# Patient Record
Sex: Male | Born: 2000 | Race: Black or African American | Hispanic: No | Marital: Single | Smoking: Never smoker
Health system: Southern US, Community
[De-identification: ages and names within clinical notes are randomized; demographics above are authoritative.]

## PROBLEM LIST (undated history)

## (undated) DIAGNOSIS — A630 Anogenital (venereal) warts: Secondary | ICD-10-CM

## (undated) DIAGNOSIS — L309 Dermatitis, unspecified: Secondary | ICD-10-CM

---

## 2020-09-07 ENCOUNTER — Emergency Department (HOSPITAL_COMMUNITY): Payer: Self-pay

## 2020-09-07 ENCOUNTER — Emergency Department (HOSPITAL_COMMUNITY)
Admission: EM | Admit: 2020-09-07 | Discharge: 2020-09-07 | Disposition: A | Discharge status: Home / Self Care | Payer: Self-pay | Attending: Emergency Medicine | Admitting: Emergency Medicine

## 2020-09-07 ENCOUNTER — Other Ambulatory Visit: Payer: Self-pay

## 2020-09-07 ENCOUNTER — Encounter (HOSPITAL_COMMUNITY): Payer: Self-pay

## 2020-09-07 DIAGNOSIS — R600 Localized edema: Secondary | ICD-10-CM | POA: Insufficient documentation

## 2020-09-07 DIAGNOSIS — Y9389 Activity, other specified: Secondary | ICD-10-CM | POA: Insufficient documentation

## 2020-09-07 DIAGNOSIS — M79645 Pain in left finger(s): Secondary | ICD-10-CM | POA: Insufficient documentation

## 2020-09-07 DIAGNOSIS — W501XXA Accidental kick by another person, initial encounter: Secondary | ICD-10-CM | POA: Insufficient documentation

## 2020-09-07 MED ORDER — IBUPROFEN 200 MG PO TABS
400.0000 mg | ORAL_TABLET | Freq: Once | ORAL | Status: AC
  Administered 2020-09-07: 400 mg via ORAL
  Filled 2020-09-07: qty 2

## 2020-09-07 MED ORDER — OXYCODONE-ACETAMINOPHEN 5-325 MG PO TABS
1.0000 | ORAL_TABLET | Freq: Once | ORAL | Status: AC
  Administered 2020-09-07: 1 via ORAL
  Filled 2020-09-07: qty 1
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
## 2020-12-18 ENCOUNTER — Other Ambulatory Visit: Payer: Self-pay

## 2020-12-18 ENCOUNTER — Emergency Department (HOSPITAL_COMMUNITY)
Admission: EM | Admit: 2020-12-18 | Discharge: 2020-12-19 | Disposition: A | Discharge status: Home / Self Care | Payer: Self-pay | Attending: Student | Admitting: Student

## 2020-12-18 DIAGNOSIS — R369 Urethral discharge, unspecified: Secondary | ICD-10-CM | POA: Insufficient documentation

## 2020-12-18 DIAGNOSIS — Z5321 Procedure and treatment not carried out due to patient leaving prior to being seen by health care provider: Secondary | ICD-10-CM | POA: Insufficient documentation

## 2020-12-18 DIAGNOSIS — Z202 Contact with and (suspected) exposure to infections with a predominantly sexual mode of transmission: Secondary | ICD-10-CM | POA: Insufficient documentation
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
## 2020-12-18 NOTE — ED Notes (Signed)
Pt left due to not being seen quick enough 

## 2020-12-19 LAB — SYPHILIS: RPR W/REFLEX TO RPR TITER AND TREPONEMAL ANTIBODIES, TRADITIONAL SCREENING AND DIAGNOSIS ALGORITHM: RPR Ser Ql: NONREACTIVE

## 2021-02-10 ENCOUNTER — Encounter (HOSPITAL_COMMUNITY): Payer: Self-pay

## 2021-02-10 ENCOUNTER — Emergency Department (HOSPITAL_COMMUNITY)
Admission: EM | Admit: 2021-02-10 | Discharge: 2021-02-10 | Disposition: A | Discharge status: Home / Self Care | Payer: Self-pay | Attending: Emergency Medicine | Admitting: Emergency Medicine

## 2021-02-10 ENCOUNTER — Other Ambulatory Visit: Payer: Self-pay

## 2021-02-10 DIAGNOSIS — K6289 Other specified diseases of anus and rectum: Secondary | ICD-10-CM

## 2021-02-10 DIAGNOSIS — L290 Pruritus ani: Secondary | ICD-10-CM | POA: Insufficient documentation

## 2021-02-10 HISTORY — DX: Anogenital (venereal) warts: A63.0

## 2021-02-10 MED ORDER — HYDROCORTISONE (PERIANAL) 2.5 % EX CREA: 1.0000 "application " | TOPICAL_CREAM | Freq: Two times a day (BID) | CUTANEOUS | 0 refills | Status: AC

## 2021-02-10 NOTE — ED Notes (Signed)
An After Visit Summary was printed and given to the patient. Discharge instructions given and no further questions at this time.  

## 2021-02-10 NOTE — Discharge Instructions (Addendum)
It is not clear exactly what the discomfort is caused by.  Stop using the podofilox cream as it is likely causing irritation.  We are prescribing a medication to help the discomfort, apply it twice a day to see if that helps.  Also soaking well daily in a warm tub will help, including cleaning well with soap and water.  Call the gastroenterologist if not better by next week.  They can see you for further evaluation and treatment.

## 2021-02-10 NOTE — ED Provider Notes (Signed)
[facility]-EMERGENCY DEPT Provider Note   CSN: [id number] Arrival date & time: 02/10/21  1557     History Chief Complaint  Patient presents with   Anal Warts    Phillip Cannon is a 20 y.o. male.  HPI He presents for sores around his anus that he is concerned about changing recently.  He has been treating himself with podofilox for anal warts, for several months.  He notes that this tends to cause the area to dry out and get irritated so he has been using it more sporadically, instead of as prescribed daily.  Over the last several days he has been concerned because he noticed bleeding and draining, when he had a bowel movement.  He is actively having sex with men.  He does not think that he has monkey box.  He denies fever, chills, nausea, vomiting or weakness or dizziness.  He is not having pain with defecation.  He does not have any abdominal pain.  There are no other known active modifying factors.    Past Medical History:  Diagnosis Date   Anal warts     There are no problems to display for this patient.   History reviewed. No pertinent surgical history.     History reviewed. No pertinent family history.  Social History   Tobacco Use   Smoking status: Never   Smokeless tobacco: Never    Home Medications Prior to Admission medications   Medication Sig Start Date End Date Taking? Authorizing Provider  hydrocortisone (ANUSOL-HC) 2.5 % rectal cream Place 1 application rectally 2 (two) times daily. 02/10/21  Yes Mancel Bale, MD    Allergies    Patient has no known allergies.  Review of Systems   Review of Systems  All other systems reviewed and are negative.  Physical Exam Updated Vital Signs BP 135/81   Pulse 66   Temp 98.7 F (37.1 C) (Oral)   Resp 18   SpO2 100%   Physical Exam Vitals and nursing note reviewed.  Constitutional:      Appearance: He is well-developed. He is not ill-appearing.  HENT:     Head:  Normocephalic and atraumatic.     Right Ear: External ear normal.     Left Ear: External ear normal.  Eyes:     Conjunctiva/sclera: Conjunctivae normal.     Pupils: Pupils are equal, round, and reactive to light.  Neck:     Trachea: Phonation normal.  Cardiovascular:     Rate and Rhythm: Normal rate.  Pulmonary:     Effort: Pulmonary effort is normal.  Genitourinary:    Comments: Normal architecture of the anus and perianal tissue.  There is mild circumferential perianal skin breakdown, of nonspecific appearance.  There are no areas of vesicles, ulcerations, bleeding or drainage.  Digital rectal examination deferred because of perianal discomfort. Musculoskeletal:        General: Normal range of motion.     Cervical back: Normal range of motion and neck supple.  Skin:    General: Skin is warm and dry.  Neurological:     Mental Status: He is alert and oriented to person, place, and time.     Cranial Nerves: No cranial nerve deficit.     Sensory: No sensory deficit.     Motor: No abnormal muscle tone.     Coordination: Coordination normal.  Psychiatric:        Mood and Affect: Mood normal.        Behavior:  Behavior normal.        Thought Content: Thought content normal.        Judgment: Judgment normal.    ED Results / Procedures / Treatments   Labs (all labs ordered are listed, but only abnormal results are displayed) Labs Reviewed - No data to display  EKG None  Radiology No results found.  Procedures Procedures   Medications Ordered in ED Medications - No data to display  ED Course  I have reviewed the triage vital signs and the nursing notes.  Pertinent labs & imaging results that were available during my care of the patient were reviewed by me and considered in my medical decision making (see chart for details).    MDM Rules/Calculators/A&P                            Patient Vitals for the past 24 hrs:  BP Temp Temp src Pulse Resp SpO2  02/10/21 1606  135/81 98.7 F (37.1 C) Oral 66 18 100 %    4:58 PM Reevaluation with update and discussion. After initial assessment and treatment, an updated evaluation reveals no change in clinical status, findings discussed with patient and all questions were answered. Mancel Bale   Medical Decision Making:  This patient is presenting for evaluation of irritation of the anus after using podofilox, for several months, which does require a range of treatment options, and is a complaint that involves a low risk of morbidity and mortality. The differential diagnoses include viral infection, inflammatory process, medication side effect. I decided to review old records, and in summary Young adult male who has sex with man presenting for evaluation of discomfort and bleeding after using cream to treat anal warts for several months.  He has tested negative for GC, chlamydia and RPR earlier this year.  I did not require additional historical information from anyone.   Critical Interventions-clinical evaluation, perianal tissue does not have a lesion that can be swabbed to evaluate for detox.  He appears to have a diffuse inflammatory process, likely secondary to medication side effect podofilox  After These Interventions, the Patient was reevaluated and was found stable for discharge.  Will prescribe medication for discomfort to treat for suspected inflammation secondary to the medicine he has been using.  He will be referred to gastroenterology to evaluate for proximal cause of discomfort including proctitis.  He has blood with bowel movement, today but it is not an ongoing problem and he has no systemic symptoms.  No indication for further ED treatment or hospitalization at this time  CRITICAL CARE-no Performed by: Mancel Bale  Nursing Notes Reviewed/ Care Coordinated Applicable Imaging Reviewed Interpretation of Laboratory Data incorporated into ED treatment  The patient appears reasonably screened and/or  stabilized for discharge and I doubt any other medical condition or other [facility] requiring further screening, evaluation, or treatment in the ED at this time prior to discharge.  Plan: Home Medications-OTC as needed; Home Treatments-perianal care; return here if the recommended treatment, does not improve the symptoms; Recommended follow up-GI follow-up ER     Final Clinical Impression(s) / ED Diagnoses Final diagnoses:  Irritation of skin of perianal region    Rx / DC Orders ED Discharge Orders          Ordered    hydrocortisone (ANUSOL-HC) 2.5 % rectal cream  2 times daily        02/10/21 1656  Mancel Bale, MD 02/10/21 [phone]

## 2021-02-10 NOTE — ED Triage Notes (Signed)
Pt states for last few weeks he has had anal warts. Pt states he went to the clinic and was given ointment, with no relief. Pt states his buttocks area is very painful.
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
## 2023-02-21 ENCOUNTER — Emergency Department (HOSPITAL_COMMUNITY)
Admission: EM | Admit: 2023-02-21 | Discharge: 2023-02-21 | Disposition: A | Discharge status: Home / Self Care | Payer: Self-pay | Attending: Emergency Medicine | Admitting: Emergency Medicine

## 2023-02-21 ENCOUNTER — Other Ambulatory Visit: Payer: Self-pay

## 2023-02-21 ENCOUNTER — Encounter (HOSPITAL_COMMUNITY): Payer: Self-pay

## 2023-02-21 DIAGNOSIS — L2082 Flexural eczema: Secondary | ICD-10-CM | POA: Insufficient documentation

## 2023-02-21 HISTORY — DX: Dermatitis, unspecified: L30.9

## 2023-02-21 MED ORDER — TRIAMCINOLONE ACETONIDE 0.1 % EX CREA: 1.0000 | TOPICAL_CREAM | Freq: Two times a day (BID) | CUTANEOUS | 1 refills | Status: AC

## 2023-02-21 NOTE — ED Triage Notes (Signed)
Patient said his eczema is flaring up. Has had it since birth but the heat is causing a flare up over the past 5 days. Located on his neck. Itching.

## 2023-02-21 NOTE — Discharge Instructions (Addendum)
You can apply the topical steroid that I prescribed up to twice daily to the affected area, avoid applying cream to the same area for longer than 2 weeks in a row without a break, avoid use in skin folds such as armpits, and on face.

## 2023-02-21 NOTE — ED Provider Notes (Signed)
[facility] EMERGENCY DEPARTMENT AT [facility] Provider Note   CSN: [id number] Arrival date & time: 02/21/23  1816     History  Chief Complaint  Patient presents with   Rash    Phillip Cannon is a 22 y.o. male with past medical history significant for eczema who presents with concern for eczema flare on neck.  Patient reports that he has been having a flare for the last 5 days.  He is out of any kind of steroid cream.  Patient reports that he also gets flareups on the backs of his knees and in his elbow creases.   Rash      Home Medications Prior to Admission medications   Medication Sig Start Date End Date Taking? Authorizing Provider  triamcinolone cream (KENALOG) 0.1 % Apply 1 Application topically 2 (two) times daily. 02/21/23  Yes Hrithik Boschee H, PA-C  hydrocortisone (ANUSOL-HC) 2.5 % rectal cream Place 1 application rectally 2 (two) times daily. 02/10/21   Mancel Bale, MD      Allergies    Patient has no known allergies.    Review of Systems   Review of Systems  Skin:  Positive for rash.  All other systems reviewed and are negative.   Physical Exam Updated Vital Signs BP 132/78 (BP Location: Right Arm)   Pulse 82   Temp 98.2 F (36.8 C) (Oral)   Resp 16   Ht 5\' 9"  (1.753 m)   Wt 67.1 kg   SpO2 99%   BMI 21.86 kg/m  Physical Exam Vitals and nursing note reviewed.  Constitutional:      General: He is not in acute distress.    Appearance: Normal appearance.  HENT:     Head: Normocephalic and atraumatic.  Eyes:     General:        Right eye: No discharge.        Left eye: No discharge.  Cardiovascular:     Rate and Rhythm: Normal rate and regular rhythm.  Pulmonary:     Effort: Pulmonary effort is normal. No respiratory distress.  Musculoskeletal:        General: No deformity.  Skin:    General: Skin is warm and dry.     Comments: Patient with raised papules with signs of excoriation on lateral sides of neck, with no  evidence of secondary infection, or purulent discharge  Neurological:     Mental Status: He is alert and oriented to person, place, and time.  Psychiatric:        Mood and Affect: Mood normal.        Behavior: Behavior normal.     ED Results / Procedures / Treatments   Labs (all labs ordered are listed, but only abnormal results are displayed) Labs Reviewed - No data to display  EKG None  Radiology No results found.  Procedures Procedures    Medications Ordered in ED Medications - No data to display  ED Course/ Medical Decision Making/ A&P                                 Medical Decision Making  This is an overall well-appearing 22 year old male who presents with concern for eczema flare on his neck.  On physical exam he does have findings consistent with eczema flare.  His flare is limited to less than 5% body surface area on lateral side of the neck alone.  Think reasonable  to treat with triamcinolone as patient reports that this has been effective in the past.  Encouraged establishing care with dermatology, avoiding overly hot showers, and encouraged daily moisturization.  Patient understands and agrees to plan, and is discharged in stable condition at this time. Final Clinical Impression(s) / ED Diagnoses Final diagnoses:  Flexural eczema    Rx / DC Orders ED Discharge Orders          Ordered    triamcinolone cream (KENALOG) 0.1 %  2 times daily        02/21/23 1849              Annette Liotta, Harrel Carina, PA-C 02/21/23 1851    Glyn Ade, MD 02/22/23 [phone]

## 2024-01-04 ENCOUNTER — Other Ambulatory Visit: Payer: Self-pay

## 2024-01-04 ENCOUNTER — Emergency Department (HOSPITAL_COMMUNITY): Payer: Self-pay

## 2024-01-04 ENCOUNTER — Emergency Department (HOSPITAL_COMMUNITY)
Admission: EM | Admit: 2024-01-04 | Discharge: 2024-01-04 | Disposition: A | Discharge status: Home / Self Care | Payer: Self-pay | Attending: Emergency Medicine | Admitting: Emergency Medicine

## 2024-01-04 ENCOUNTER — Encounter (HOSPITAL_COMMUNITY): Payer: Self-pay

## 2024-01-04 DIAGNOSIS — R066 Hiccough: Secondary | ICD-10-CM | POA: Insufficient documentation

## 2024-01-04 MED ORDER — OMEPRAZOLE 20 MG PO CPDR: 20.0000 mg | DELAYED_RELEASE_CAPSULE | Freq: Every day | ORAL | 0 refills | Status: AC

## 2024-01-04 MED ORDER — SUCRALFATE 1 GM/10ML PO SUSP
1.0000 g | Freq: Three times a day (TID) | ORAL | Status: DC
  Administered 2024-01-04: 1 g via ORAL
  Filled 2024-01-04: qty 10

## 2024-01-04 MED ORDER — PANTOPRAZOLE SODIUM 20 MG PO TBEC
20.0000 mg | DELAYED_RELEASE_TABLET | Freq: Once | ORAL | Status: AC
  Administered 2024-01-04: 20 mg via ORAL
  Filled 2024-01-04: qty 1

## 2024-01-04 MED ORDER — SUCRALFATE 1 GM/10ML PO SUSP: 1.0000 g | Freq: Three times a day (TID) | ORAL | 0 refills | Status: AC

## 2024-01-04 NOTE — Discharge Instructions (Signed)
 1.  At this time, first plan for treatment will be to start medications for gastroesophageal reflux.  You described some pain with swallowing and eating larger pieces of food.  Take omeprazole  every day in the morning about 20 minutes before you eat anything.  Continue this daily for the next month.  Take Carafate  as prescribed for the next week. 2.  Review recommendations for food choices for gastroesophageal reflux disease.  Make sure you are eating smaller pieces of food which are soft and easily chewed and swallowed. 3.  It is important that you get a follow-up evaluation.  Sometimes you may develop significant esophageal inflammation and need to get an upper gastroesophageal endoscopy.  4.  You need to follow-up with a family doctor to see how you are responding to treatment.  If you do not have 1, use the referral number in your discharge instructions to help find one.

## 2024-01-04 NOTE — ED Provider Notes (Signed)
 [place] EMERGENCY DEPARTMENT AT [facility] Provider Note   CSN: [id number] Arrival date & time: 01/04/24  9164     Patient presents with: Hiccups   Phillip Cannon is a 23 y.o. male.   HPI Patient reports that he was eating about a week ago.  He was eating a sandwich and had discomfort with swallowing but did not have any food specifically get stuck.  He reports since that time, he has had hiccups that continue to recur.  Patient reports that when he goes to sleep the hiccups stopped.  He reports that when he is awake they come and go.  Sometimes they will last about 20 minutes.  He reports the longest time they have lasted is about 2 hours.  Sometimes he goes for a number of hours being symptom-free.  Patient denies that he vomited.  He reports that he has intermittently had trouble with some discomfort swallowing larger pieces of food.  He denies that anything has ever specifically gotten stuck.  He has never had a diagnosis of reflux or had a endoscopy.  Patient reports that since the episode he has been able to eat and drink.  However he has continued to get frequent hiccups and at this point has chest discomfort and feels slightly short of breath.  Denies he has any other symptoms.  He has not had headaches, fever, chills, abdominal pain, vomiting.    Prior to Admission medications   Medication Sig Start Date End Date Taking? Authorizing Provider  hydrocortisone  (ANUSOL -HC) 2.5 % rectal cream Place 1 application rectally 2 (two) times daily. 02/10/21   Lorriane Holmes, MD  omeprazole  (PRILOSEC) 20 MG capsule Take 1 capsule (20 mg total) by mouth daily. 01/04/24  Yes Armenta Canning, MD  sucralfate  (CARAFATE ) 1 GM/10ML suspension Take 10 mLs (1 g total) by mouth 4 (four) times daily -  with meals and at bedtime. 01/04/24  Yes Armenta Canning, MD  triamcinolone  cream (KENALOG ) 0.1 % Apply 1 Application topically 2 (two) times daily. 02/21/23   Prosperi, Christian H, PA-C     Allergies: Patient has no known allergies.    Review of Systems  Updated Vital Signs BP (!) 141/72 (BP Location: Left Arm)   Pulse 73   Temp 98.3 F (36.8 C) (Oral)   Resp 18   Ht 5' 9 (1.753 m)   Wt 68.9 kg   SpO2 97%   BMI 22.45 kg/m   Physical Exam Constitutional:      Comments: Alert nontoxic no respiratory distress well-nourished well-developed  HENT:     Head: Normocephalic and atraumatic.     Mouth/Throat:     Mouth: Mucous membranes are moist.     Pharynx: Oropharynx is clear.  Eyes:     Extraocular Movements: Extraocular movements intact.  Cardiovascular:     Rate and Rhythm: Normal rate and regular rhythm.     Heart sounds: Normal heart sounds.  Pulmonary:     Effort: Pulmonary effort is normal.     Breath sounds: Normal breath sounds.  Abdominal:     General: There is no distension.     Palpations: Abdomen is soft.     Tenderness: There is no abdominal tenderness. There is no guarding.  Musculoskeletal:        General: No swelling or tenderness. Normal range of motion.     Right lower leg: No edema.     Left lower leg: No edema.  Skin:    General: Skin is  warm and dry.  Neurological:     General: No focal deficit present.     Mental Status: He is oriented to person, place, and time.     Motor: No weakness.     Coordination: Coordination normal.  Psychiatric:        Mood and Affect: Mood normal.     (all labs ordered are listed, but only abnormal results are displayed) Labs Reviewed - No data to display  EKG: None  Radiology: DG Abd Acute W/Chest Result Date: 01/04/2024 CLINICAL DATA:  Hiccups for 1 week. Shortness of breath and chest pain for 3 days. EXAM: DG ABDOMEN ACUTE WITH 1 VIEW CHEST COMPARISON:  Chest radiographs 08/14/2007. FINDINGS: The heart size and mediastinal contours are normal. The lungs are clear. There is no pleural effusion or pneumothorax. No acute osseous findings are identified. There is a normal nonobstructive bowel  gas pattern. Mildly prominent stool in the rectum. No evidence of pneumoperitoneum or suspicious abdominal calcification. No asymmetric hemidiaphragm elevation. IMPRESSION: No evidence of acute cardiopulmonary or abdominal process. Mildly prominent stool in the rectum. Electronically Signed   By: Elsie Perone M.D.   On: 01/04/2024 10:44     Procedures   Medications Ordered in the ED  sucralfate  (CARAFATE ) 1 GM/10ML suspension 1 g (1 g Oral Given 01/04/24 0954)  pantoprazole  (PROTONIX ) EC tablet 20 mg (20 mg Oral Given 01/04/24 0949)                                    Medical Decision Making Amount and/or Complexity of Data Reviewed Radiology: ordered.  Risk Prescription drug management.   Patient presents as outlined with recurrent hiccups for approximately 1 week.  They are coming and going.  They do go away at night when he goes to sleep.  Patient traces this back to an episode of eating whereby he had some difficulty swallowing.  Patient describes intermittently perceiving some discomfort and having to do extra swallowing for larger pieces of food.  However, he is never had esophageal food impaction.  Patient does not have episode of vomiting.  Patient has no prior history of having EGD.  Will proceed with chest x-ray.  Chest x-ray was abdominal x-ray personally reviewed by myself and read by radiology no acute findings.  I have checked back on the patient.  He was given a dose of Protonix  and Carafate .  He is resting quietly and is not having any hiccups at this time.  Currently the plan will be to treat for reflux.  I have some suspicion of reflux with possible esophagitis or early stricture given patient's description of intermittent dysphagia with larger pieces of food.  I have carefully reviewed with the patient the plan to start treatment with a PPI and Carafate .  Patient is counseled on the importance of follow-up to make sure that symptoms resolve and further evaluation for  possible EGD in the setting of dysphagia with larger food.  Patient voices understanding.     Final diagnoses:  Hiccups    ED Discharge Orders          Ordered    omeprazole  (PRILOSEC) 20 MG capsule  Daily        01/04/24 1043    sucralfate  (CARAFATE ) 1 GM/10ML suspension  3 times daily with meals & bedtime        01/04/24 1043  Armenta Canning, MD 01/04/24 1055

## 2024-01-04 NOTE — ED Triage Notes (Signed)
 Pt from home for reports of constant hiccups. Pt reports they started 1 week ago after eating at work. Pt also reports SOB and chest pain that started 3 days ago r/t hiccups. Pt stated he tried at home remedies to make them stop and nothing has worked.
